# Patient Record
Sex: Male | Born: 2000 | Race: White | Hispanic: No | Marital: Single | Smoking: Never smoker
Health system: Southern US, Community
[De-identification: ages and names within clinical notes are randomized; demographics above are authoritative.]

---
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
## 2019-04-23 ENCOUNTER — Emergency Department (HOSPITAL_COMMUNITY): Payer: BC Managed Care – PPO

## 2019-04-23 ENCOUNTER — Encounter (HOSPITAL_COMMUNITY): Payer: Self-pay | Admitting: Emergency Medicine

## 2019-04-23 ENCOUNTER — Emergency Department (HOSPITAL_COMMUNITY)
Admission: EM | Admit: 2019-04-23 | Discharge: 2019-04-23 | Disposition: A | Discharge status: Home / Self Care | Payer: BC Managed Care – PPO | Attending: Emergency Medicine | Admitting: Emergency Medicine

## 2019-04-23 DIAGNOSIS — R0782 Intercostal pain: Secondary | ICD-10-CM | POA: Insufficient documentation

## 2019-04-23 DIAGNOSIS — Y929 Unspecified place or not applicable: Secondary | ICD-10-CM | POA: Insufficient documentation

## 2019-04-23 DIAGNOSIS — Y9351 Activity, roller skating (inline) and skateboarding: Secondary | ICD-10-CM | POA: Insufficient documentation

## 2019-04-23 DIAGNOSIS — M25522 Pain in left elbow: Secondary | ICD-10-CM | POA: Insufficient documentation

## 2019-04-23 DIAGNOSIS — S42022A Displaced fracture of shaft of left clavicle, initial encounter for closed fracture: Secondary | ICD-10-CM | POA: Insufficient documentation

## 2019-04-23 DIAGNOSIS — M549 Dorsalgia, unspecified: Secondary | ICD-10-CM | POA: Diagnosis not present

## 2019-04-23 DIAGNOSIS — Y999 Unspecified external cause status: Secondary | ICD-10-CM | POA: Insufficient documentation

## 2019-04-23 DIAGNOSIS — S4992XA Unspecified injury of left shoulder and upper arm, initial encounter: Secondary | ICD-10-CM | POA: Diagnosis present

## 2019-04-23 DIAGNOSIS — S20412A Abrasion of left back wall of thorax, initial encounter: Secondary | ICD-10-CM | POA: Insufficient documentation

## 2019-04-23 DIAGNOSIS — W19XXXA Unspecified fall, initial encounter: Secondary | ICD-10-CM

## 2019-04-23 DIAGNOSIS — M542 Cervicalgia: Secondary | ICD-10-CM | POA: Diagnosis not present

## 2019-04-23 MED ORDER — FENTANYL CITRATE (PF) 100 MCG/2ML IJ SOLN
50.0000 ug | Freq: Once | INTRAMUSCULAR | Status: AC
  Administered 2019-04-23: 17:00:00 50 ug via INTRAVENOUS
  Filled 2019-04-23: qty 2

## 2019-04-23 MED ORDER — OXYCODONE-ACETAMINOPHEN 5-325 MG PO TABS: 1.0000 | ORAL_TABLET | Freq: Four times a day (QID) | ORAL | 0 refills | Status: DC | PRN

## 2019-04-23 NOTE — Discharge Instructions (Addendum)
Please read and follow all provided instructions.  You have been seen today for a fall.  Your x-ray showed that you have a left clavicle fracture, we have placed you in a sling immobilizer for this, please keep this on at all times until you have followed up with orthopedics.  Please follow-up within the next couple of days.  Home care instructions: -- *PRICE in the first 24-48 hours after injury: Protect (with brace, splint, sling), if given by your provider Rest Ice- Do not apply ice pack directly to your skin, place towel or similar between your skin and ice/ice pack. Apply ice for 20 min, then remove for 40 min while awake Compression- Wear brace, elastic bandage, splint as directed by your provider Elevate affected extremity above the level of your heart when not walking around for the first 24-48 hours   Please apply antibiotic ointment to the abrasions to your back & arms.   Medications:  Please take ibuprofen for over the counter dosing.  Take Percocet for pain not alleviated by ibuprofen. -Percocet-this is a narcotic/controlled substance medication that has potential addicting qualities.  We recommend that you take 1-2 tablets every 6 hours as needed for severe pain.  Do not drive or operate heavy machinery when taking this medicine as it can be sedating. Do not drink alcohol or take other sedating medications when taking this medicine for safety reasons.  Keep this out of reach of small children.  Please be aware this medicine has Tylenol in it (325 mg/tab) do not exceed the maximum dose of Tylenol in a day per over the counter recommendations should you decide to supplement with Tylenol over the counter.   We have prescribed you new medication(s) today. Discuss the medications prescribed today with your pharmacist as they can have adverse effects and interactions with your other medicines including over the counter and prescribed medications. Seek medical evaluation if you start to  experience new or abnormal symptoms after taking one of these medicines, seek care immediately if you start to experience difficulty breathing, feeling of your throat closing, facial swelling, or rash as these could be indications of a more serious allergic reaction   Follow-up instructions: Please follow-up with orthopedics within the next 1-4 days.   Return instructions:  Please return if your digits or extremity are numb or tingling, appear gray or blue, or you have severe pain (also elevate the extremity and loosen splint or wrap if you were given one) Please return if you have redness or fevers.  Please return to the Emergency Department if you experience worsening symptoms.  Please return if you have any other emergent concerns. Additional Information:  Your vital signs today were: BP (!) 111/58 (BP Location: Right Arm)    Pulse 81    Temp 98.5 F (36.9 C) (Oral)    Resp 16    Ht 6' (1.829 m)    Wt 68 kg    SpO2 94%    BMI 20.34 kg/m  If your blood pressure (BP) was elevated above 135/85 this visit, please have this repeated by your doctor within one month. ---------------

## 2019-04-23 NOTE — ED Triage Notes (Signed)
Pt arrives to ED from home with complaints of falling off his skateboard injuring his left collar bone, neck, and back. EMS states possible left collar bone deformity. EMS gave 119mcg fentanyl.

## 2019-04-23 NOTE — ED Provider Notes (Signed)
[place] EMERGENCY DEPARTMENT Provider Note   CSN: [id number] Arrival date & time: 04/23/19  1536     History   Chief Complaint Chief Complaint  Patient presents with   Fall    HPI Calvin Perry is a 18 y.o. male without significant past medical history who presents to the emergency department via EMS status post mechanical fall from longboard with primary complaint of L clavicle pain.  Patient states that he was riding his long board fairly slowly when he had a tree branch causing him to fall.  He states he fell onto his left shoulder/side.  Denies head injury or loss of consciousness.  Was able to get up without assistance.  He notes he is having pain primarily to the left clavicle/shoulder area as well as to the neck and upper back.  His current pain is a 9 out of 10, this is somewhat improved following fentanyl per EMS in route, worse with movement.  Denies headache, change in vision, nausea, vomiting, numbness, weakness, paresthesias, abdominal pain, dyspnea, or hemoptysis.  States that he received vaccines prior to college, likely includes tetanus.     HPI  History reviewed. No pertinent past medical history.  There are no active problems to display for this patient.   History reviewed. No pertinent surgical history.      Home Medications    Prior to Admission medications   Medication Sig Start Date End Date Taking? Authorizing Provider  ibuprofen (ADVIL,MOTRIN) 400 MG tablet Take 1 tablet (400 mg total) by mouth every 6 (six) hours as needed. 07/12/14   Charlann Lange, PA-C    Family History History reviewed. No pertinent family history.  Social History Social History   Tobacco Use   Smoking status: Never Smoker  Substance Use Topics   Alcohol use: No   Drug use: No     Allergies   Patient has no known allergies.   Review of Systems Review of Systems  Constitutional: Negative for chills and fever.  Eyes: Negative for visual  disturbance.  Respiratory: Negative for shortness of breath.   Cardiovascular: Positive for chest pain (left clavicle/upper ribs).  Gastrointestinal: Negative for abdominal pain, nausea and vomiting.  Musculoskeletal: Positive for arthralgias, back pain and neck pain.  Skin: Positive for wound.  Neurological: Negative for weakness, numbness and headaches.       Negative for paresthesias.  All other systems reviewed and are negative.    Physical Exam Updated Vital Signs BP (!) 111/58 (BP Location: Right Arm)    Pulse 81    Temp 98.5 F (36.9 C) (Oral)    Resp 16    Ht 6' (1.829 m)    Wt 68 kg    SpO2 94%    BMI 20.34 kg/m   Physical Exam Vitals signs and nursing note reviewed.  Constitutional:      General: He is not in acute distress.    Appearance: He is well-developed. He is not toxic-appearing.  HENT:     Head: Normocephalic and atraumatic.     Comments: No raccoon eyes or battle sign.    Ears:     Comments: No hemotympanum.    Mouth/Throat:     Comments: Uvula midline. Eyes:     General:        Right eye: No discharge.        Left eye: No discharge.     Extraocular Movements: Extraocular movements intact.     Conjunctiva/sclera: Conjunctivae normal.  Pupils: Pupils are equal, round, and reactive to light.  Neck:     Musculoskeletal: Neck supple.     Comments: C-collar in place.  Patient has diffuse midline and left-sided paraspinal muscle tenderness with palpation through c-collar.  C-collar maintained in place.  No palpable step-off or point/focal bony tenderness. Cardiovascular:     Rate and Rhythm: Normal rate and regular rhythm.  Pulmonary:     Effort: Pulmonary effort is normal. No respiratory distress.     Breath sounds: Normal breath sounds. No wheezing, rhonchi or rales.     Comments: No palpable crepitus or step-off. Chest:     Chest wall: Tenderness (Left anterior, lateral, and posterior chest wall.  Is tenderness over the left clavicle.) present.    Abdominal:     General: There is no distension.     Palpations: Abdomen is soft.     Tenderness: There is no abdominal tenderness. There is no guarding or rebound.  Musculoskeletal:     Comments: Upper extremities: Patient has intact active range of motion to the right shoulder, bilateral elbows, bilateral wrist, and all digits.  His left shoulder flexion and extension is limited fairly significantly secondary to pain per his report.  LUE: He is tender to palpation diffusely about the posterior aspect of the elbow.  He is also tender diffusely to the glenohumeral joint, the [facility] joint, the CC joint, the clavicle, and the scapula.  Upper extremities are otherwise nontender. Back: Patient is tender to palpation to the left paraspinal muscles including the left posterior ribs.  He does have some mild tenderness to the midline thoracic area diffusely without point/focal bony tenderness or palpable step-off.  L-spine is nontender. Lower extremities: Normal active range of motion throughout without point/focal bony tenderness.  Skin:    General: Skin is warm and dry.     Capillary Refill: Capillary refill takes less than 2 seconds.     Comments: Patient has notable abrasions to the left upper arm as well as to the left side of the posterior chest extending to the mid back.  There is also an abrasion to the R elbow. No significant active bleeding.  Neurological:     Mental Status: He is alert.     Comments: Clear speech.  CN III through XII grossly intact.  Sensation grossly intact bilateral upper and lower extremities.  5 out of 5 symmetric grip strength and 5 out of 5 strength with plantar and dorsiflexion bilaterally.  Able to perform okay sign, thumbs up, and cross second and third digits with bilateral upper extremities.  Psychiatric:        Behavior: Behavior normal.    ED Treatments / Results  Labs (all labs ordered are listed, but only abnormal results are displayed) Labs Reviewed - No data to  display  EKG None  Radiology Dg Ribs Unilateral W/chest Left  Result Date: 04/23/2019 CLINICAL DATA:  18 year old male status post fall from long board. Pain. Left clavicle fracture. EXAM: LEFT RIBS AND CHEST - 3+ VIEW COMPARISON:  Thoracic radiographs today. FINDINGS: AP supine chest. Normal lung volumes. Normal cardiac size and mediastinal contours. Visualized tracheal air column is within normal limits. Both lungs appear clear. No pneumothorax or pleural effusion. Comminuted and displaced midshaft left clavicle fracture. Normal underlying bone mineralization. AP and oblique views of the left ribs. No rib fracture identified. Negative visible bowel gas pattern. Negative visible spine. IMPRESSION: 1. No rib fracture identified. Comminuted midshaft left clavicle fracture. 2. No acute cardiopulmonary abnormality.  Electronically Signed   By: Odessa FlemingH  Hall M.D.   On: 04/23/2019 18:18   Dg Thoracic Spine 2 View  Result Date: 04/23/2019 CLINICAL DATA:  18 year old male status post fall from long board. Pain including at the left clavicle. EXAM: THORACIC SPINE 2 VIEWS COMPARISON:  Thoracic spine radiographs 06/15/2010. FINDINGS: Normal thoracic segmentation as seen previously. Nearing skeletal maturity. Bone mineralization is within normal limits. Cervicothoracic junction alignment is within normal limits. Preserved thoracic vertebral height and alignment. Posterior ribs appear intact. Negative visible chest and upper abdominal visceral contours. IMPRESSION: No acute osseous abnormality identified in the thoracic spine. Electronically Signed   By: Odessa FlemingH  Hall M.D.   On: 04/23/2019 18:13   Dg Clavicle Left  Result Date: 04/23/2019 CLINICAL DATA:  Larey SeatFell from long board and injured left shoulder. EXAM: LEFT CLAVICLE - 2+ VIEWS COMPARISON:  None. FINDINGS: There is a displaced and comminuted fracture of the midclavicle. The proximal clavicle is elevated approximately 1 shaft with and there is a moderate-sized butterfly  type fragment. The sternoclavicular and AC joints are intact. IMPRESSION: Displaced and comminuted fracture of the mid clavicle. Electronically Signed   By: Rudie MeyerP.  Gallerani M.D.   On: 04/23/2019 18:14   Dg Elbow Complete Left  Result Date: 04/23/2019 CLINICAL DATA:  18 year old male status post fall from long board. Pain. EXAM: LEFT ELBOW - COMPLETE 3+ VIEW COMPARISON:  Left elbow series 01/06/2006. FINDINGS: Nearing skeletal maturity. Bone mineralization is within normal limits. Incidental antecubital fossa IV access. No evidence of joint effusion. Normal joint spaces and alignment. Radial head appears intact. No osseous abnormality identified. No discrete soft tissue injury identified. IMPRESSION: Negative. Electronically Signed   By: Odessa FlemingH  Hall M.D.   On: 04/23/2019 18:15   Ct Cervical Spine Wo Contrast  Result Date: 04/23/2019 CLINICAL DATA:  18 year old male status post fall from long board. Pain. Left clavicle fracture. EXAM: CT CERVICAL SPINE WITHOUT CONTRAST TECHNIQUE: Multidetector CT imaging of the cervical spine was performed without intravenous contrast. Multiplanar CT image reconstructions were also generated. COMPARISON:  Head and cervical spine CT 06/15/2010. FINDINGS: Alignment: Straightening of cervical lordosis. Cervicothoracic junction alignment is within normal limits. Bilateral posterior element alignment is within normal limits. Skull base and vertebrae: Visualized skull base is intact. No atlanto-occipital dissociation. Cervical vertebrae appear intact. Soft tissues and spinal canal: No prevertebral fluid or swelling. No visible canal hematoma. Negative noncontrast neck soft tissues. Disc levels:  No degenerative changes. Upper chest: Visible upper thoracic levels appear intact. Negative lung apices. Left clavicle fracture visible on the scout view. Other: Elongated stylohyoid ligament calcification. Negative visible posterior fossa. Visible tympanic cavities and mastoids are clear.  IMPRESSION: 1. No acute traumatic injury identified in the cervical spine. 2. Left clavicle fracture visible on the scout view. Electronically Signed   By: Odessa FlemingH  Hall M.D.   On: 04/23/2019 18:22   Dg Shoulder Left  Result Date: 04/23/2019 CLINICAL DATA:  18 year old male status post fall from long board. Pain. EXAM: LEFT SHOULDER - 2+ VIEW COMPARISON:  Thoracic spine radiographs 06/15/2010. FINDINGS: Comminuted and displaced midshaft left clavicle fracture. One full shaft with inferior displacement. Over riding of fragments at least 1 centimeter. Left AC joint alignment appears to remain normal. No glenohumeral joint dislocation. Left scapula and proximal left humerus appear intact. Negative visible left ribs and chest. IMPRESSION: Comminuted and displaced midshaft left clavicle fracture. No other acute fracture or dislocation identified about the left shoulder. Electronically Signed   By: Odessa FlemingH  Hall M.D.   On:  04/23/2019 18:16    Procedures Procedures (including critical care time)  SPLINT APPLICATION Date/Time: 7:19 PM Authorized by: Harvie HeckSamantha Nnenna Meador Consent: Verbal consent obtained. Risks and benefits: risks, benefits and alternatives were discussed Consent given by: patient Splint applied by: RN Location details: LUE Splint type: sling immobilizer Supplies used: sling immobilizer.  Post-procedure: The splinted body part was neurovascularly unchanged following the procedure. Patient tolerance: Patient tolerated the procedure well with no immediate complications.  Medications Ordered in ED Medications  fentaNYL (SUBLIMAZE) injection 50 mcg (has no administration in time range)    Initial Impression / Assessment and Plan / ED Course  I have reviewed the triage vital signs and the nursing notes.  Pertinent labs & imaging results that were available during my care of the patient were reviewed by me and considered in my medical decision making (see chart for details).   Patient presents  to the emergency department status post fall from his long board with primary complaint of left clavicle pain.  Patient is nontoxic-appearing, resting comfortably, vitals without significant abnormality- initial SpO2 documented @ 94% has been at 98-100% on each of my assessments.  Patient did not strike his head, he is neurologically intact, doubt head bleed.  C-collar in place, diffuse midline tenderness, CT cervical spine obtained per [place] rules, negative for fracture or dislocation- cleared.  Patient did have some diffuse thoracic tenderness, x-ray negative for fracture or dislocation.  No L-spine tenderness.  Musculoskeletal x-rays ordered in areas of tenderness, specifically to the left upper extremity/left chest.   Imaging notable for displaced and comminuted fracture of the mid clavicle. Imaging has been reviewed by me personally. Fracture is not to the medial aspect of the clavicle, no signs of respiratory distress, lungs clear, no hemoptysis. Doubt significant traumatic intrathoracic injury in addition to mid clavicle fx.  No skin tenting.  Neurovascular intact distally. Will place patient in sling immobilizer, recommendation for PRICE, short course of percocet for pain [facility]([place] Controlled Substance reporting System queried) with orthopedics follow up.   Abrasions to L upper arm/back do not appear to require closure w/ sutures/staples/adhesives, tetanus up to date. Wounds irrigated and cleaned by nursing staff. Wounds are not over clavicular fx, not open fx.   I discussed results, treatment plan, need for follow-up, and return precautions with the patient. Provided opportunity for questions, patient confirmed understanding and is in agreement with plan.   Findings and plan of care discussed with supervising physician Dr. Anitra LauthPlunkett who is in agreement.   Blood pressure (!) 111/58, pulse 81, temperature 98.5 F (36.9 C), temperature source Oral, resp. rate 16, height 6' (1.829 m),  weight 68 kg, SpO2 100 %.  Final Clinical Impressions(s) / ED Diagnoses   Final diagnoses:  Fall, initial encounter  Closed displaced fracture of shaft of left clavicle, initial encounter    ED Discharge Orders         Ordered    oxyCODONE-acetaminophen (PERCOCET/ROXICET) 5-325 MG tablet  Every 6 hours PRN     04/23/19 1927           Cherly Andersonetrucelli, Qualyn Oyervides R, PA-C 04/23/19 1932    Gwyneth SproutPlunkett, Whitney, MD 04/23/19 2135

## 2019-04-23 NOTE — ED Notes (Signed)
Mom- [phone] for updates

## 2019-12-04 DIAGNOSIS — L700 Acne vulgaris: Secondary | ICD-10-CM | POA: Diagnosis not present

## 2020-09-15 IMAGING — DX LEFT RIBS AND CHEST - 3+ VIEW
6 series · 6 of 6 positions shown · non-contrast
Comparison: Thoracic radiographs today.

CLINICAL DATA: 18-year-old male status post fall from long board.
Pain. Left clavicle fracture.

EXAM:
LEFT RIBS AND CHEST - 3+ VIEW

[chest ap (1 of 2)]
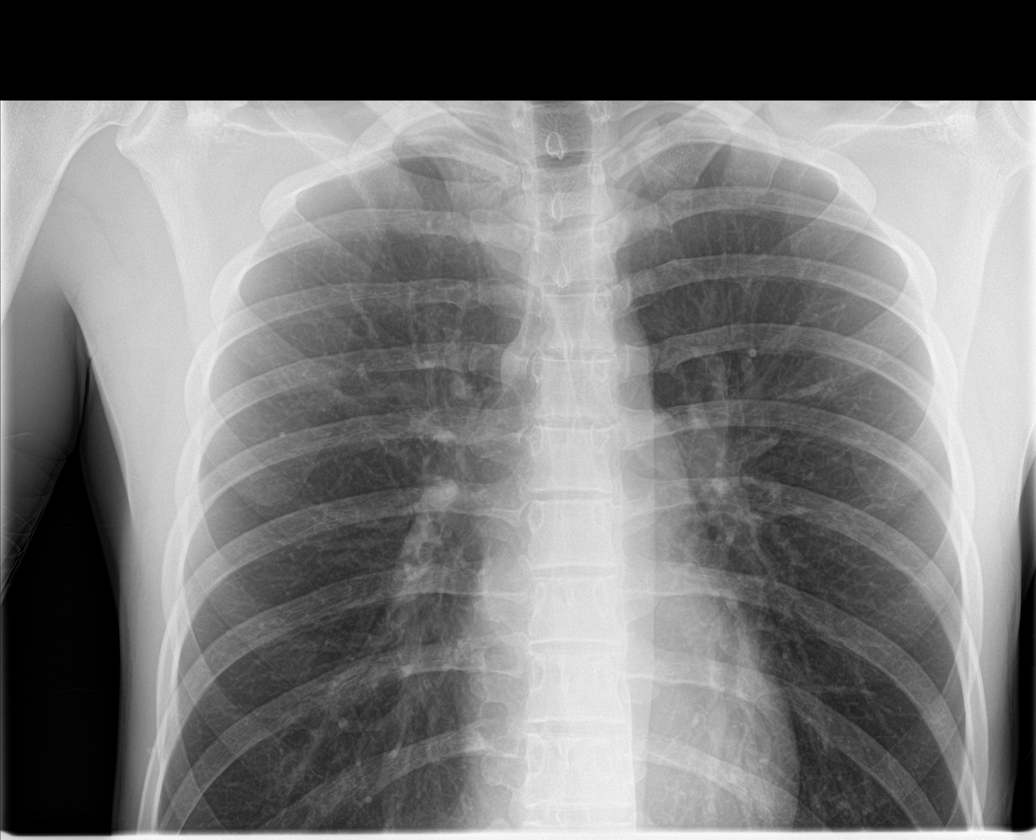

[rib ap (1 of 2)]
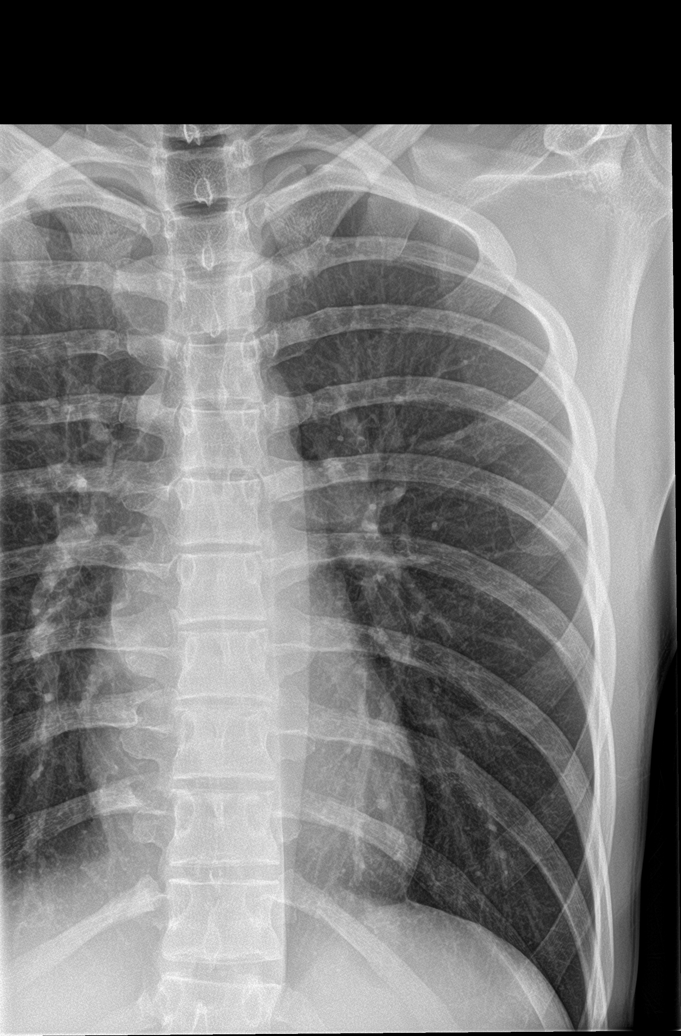

[rib ap obl (1 of 2)]
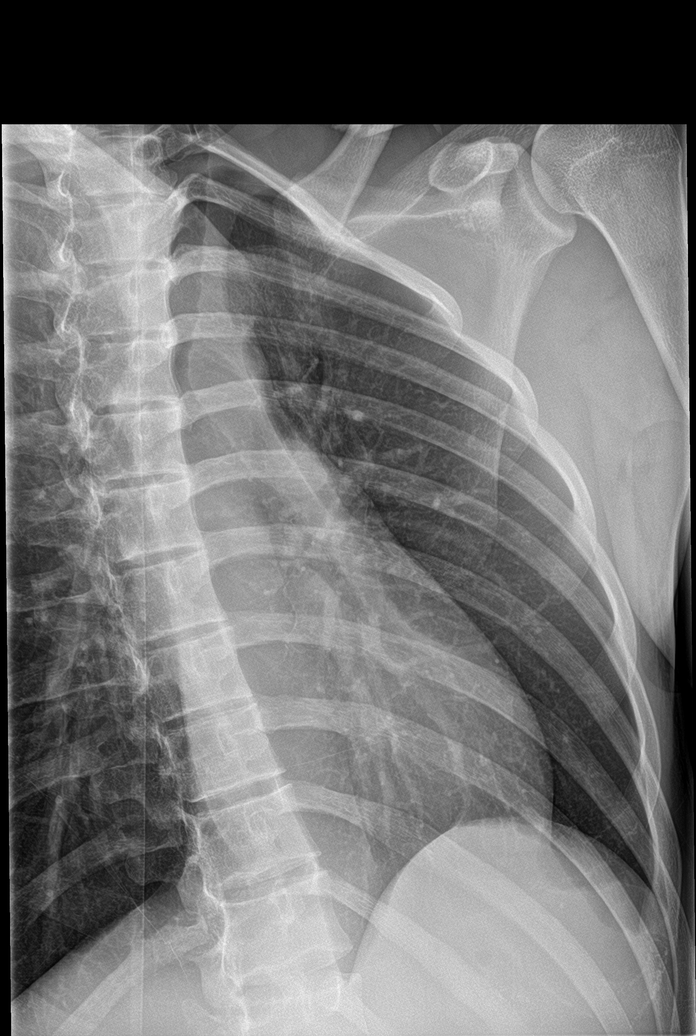

[chest ap (2 of 2)]
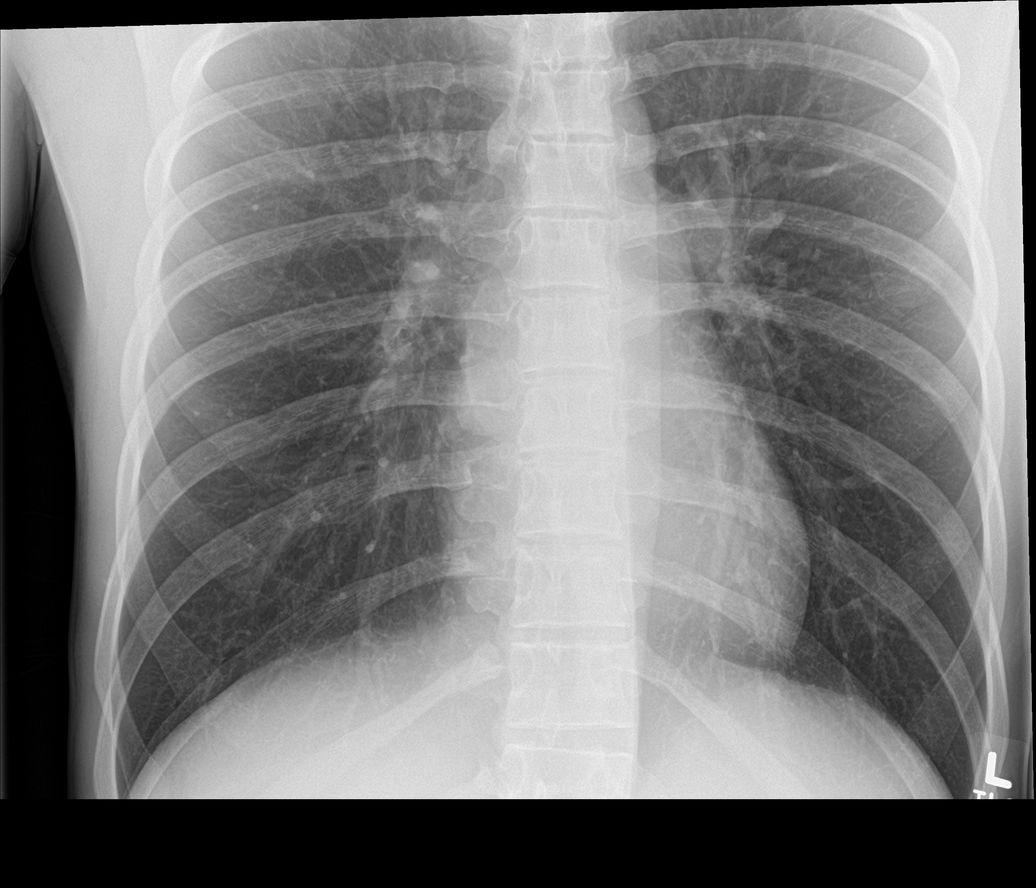

[rib ap (2 of 2)]
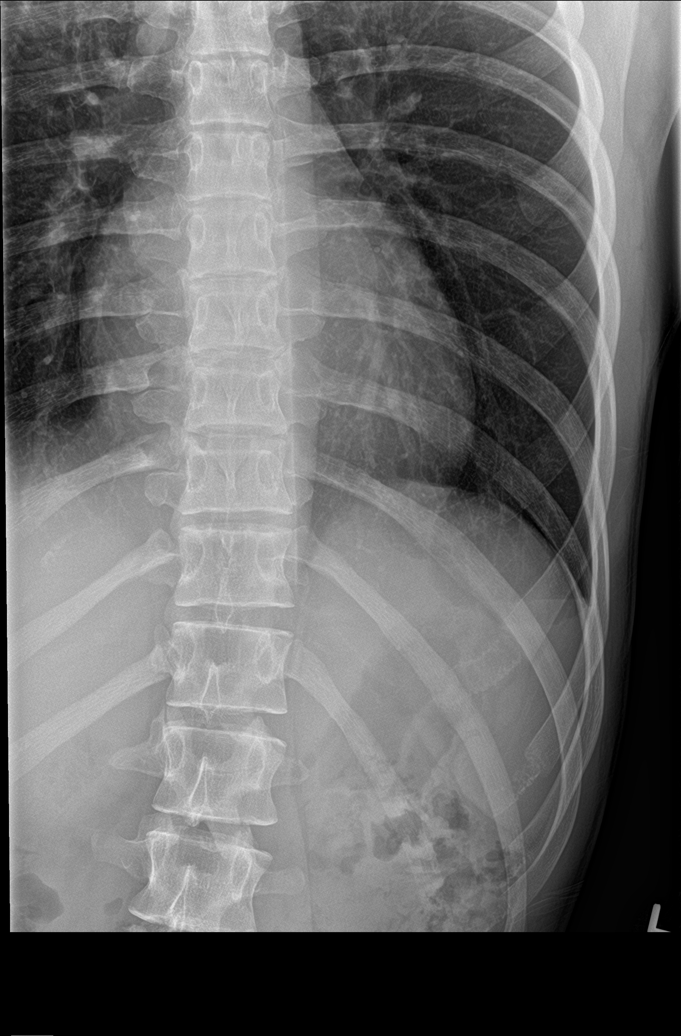

[rib ap obl (2 of 2)]
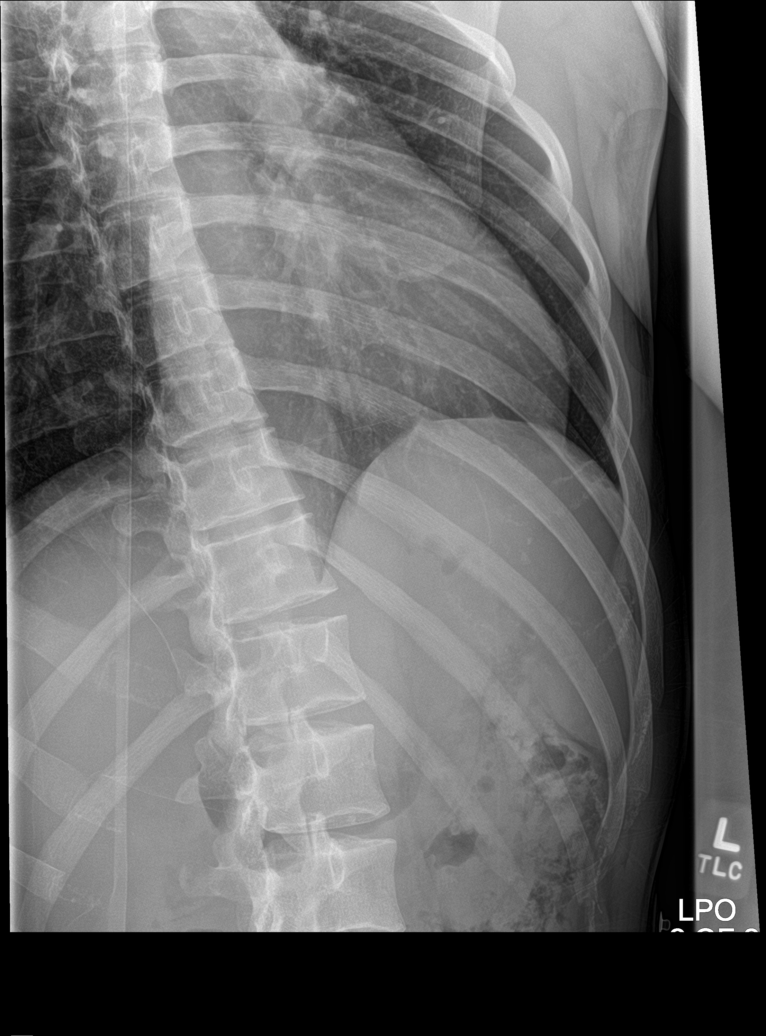

[6 of 6 positions shown; findings below may reference images not displayed]

FINDINGS: AP supine chest. Normal lung volumes. Normal cardiac size and
mediastinal contours. Visualized tracheal air column is within
normal limits. Both lungs appear clear. No pneumothorax or pleural
effusion.

Comminuted and displaced midshaft left clavicle fracture.

Normal underlying bone mineralization. AP and oblique views of the
left ribs. No rib fracture identified. Negative visible bowel gas
pattern. Negative visible spine.
IMPRESSION: 1. No rib fracture identified. Comminuted midshaft left clavicle
fracture.
2. No acute cardiopulmonary abnormality.

## 2021-04-21 DIAGNOSIS — Z0000 Encounter for general adult medical examination without abnormal findings: Secondary | ICD-10-CM | POA: Diagnosis not present

## 2021-04-21 DIAGNOSIS — E559 Vitamin D deficiency, unspecified: Secondary | ICD-10-CM | POA: Diagnosis not present

## 2021-04-21 DIAGNOSIS — Z13220 Encounter for screening for lipoid disorders: Secondary | ICD-10-CM | POA: Diagnosis not present

## 2021-04-28 DIAGNOSIS — L700 Acne vulgaris: Secondary | ICD-10-CM | POA: Diagnosis not present

## 2021-09-15 ENCOUNTER — Ambulatory Visit
Admission: RE | Admit: 2021-09-15 | Discharge: 2021-09-15 | Disposition: A | Discharge status: Home / Self Care | Payer: BC Managed Care – PPO | Source: Ambulatory Visit | Attending: Family Medicine | Admitting: Family Medicine

## 2021-09-15 ENCOUNTER — Ambulatory Visit (INDEPENDENT_AMBULATORY_CARE_PROVIDER_SITE_OTHER): Payer: BC Managed Care – PPO | Admitting: Family Medicine

## 2021-09-15 ENCOUNTER — Encounter: Payer: Self-pay | Admitting: Family Medicine

## 2021-09-15 VITALS — BP 112/74 | Wt 180.0 lb

## 2021-09-15 DIAGNOSIS — M545 Low back pain, unspecified: Secondary | ICD-10-CM | POA: Diagnosis not present

## 2021-09-15 DIAGNOSIS — G8929 Other chronic pain: Secondary | ICD-10-CM

## 2021-09-15 NOTE — Patient Instructions (Signed)
We will go ahead with an MRI of your lumbar spine to further assess for a disc herniation. I'll call you with the results of these and next steps. You have a lumbar strain. Ok to take tylenol for baseline pain relief (1-2 extra strength tabs 3x/day) Take aleve or ibuprofen with food for pain and inflammation if needed. Stay as active as possible. Do home exercises and stretches as directed most days of the week. Consider physical therapy. Strengthening of low back muscles, abdominal musculature are key for long term pain relief.

## 2021-09-15 NOTE — Progress Notes (Signed)
PCP: Timothy Lasso, MD  Subjective:   HPI: Patient is a 20 y.o. male here for low back pain.  Patient reports 3 year history of right sided low back pain. 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 Came down from a rebound and felt a sharp pain right side of low back. Recurrence when doing leg press then most recently when he played golf. Each time they last about 1.5 weeks then resolve. No radiation into extremities. No numbness or tingling. No bowel/bladder dysfunction. Worse with flexion. Takes ibuprofen when these occur.  History reviewed. No pertinent past medical history.  No current outpatient medications on file prior to visit.   No current facility-administered medications on file prior to visit.    History reviewed. No pertinent surgical history.  No Known Allergies  BP 112/74   Wt 180 lb (81.6 kg)   BMI 24.41 kg/m   [facility] 09/15/2021  Frequency of aerobic exercise (# of days/week) 6  Average time in minutes 30  Frequency of strengthening activities (# of days/week) 6    No flowsheet data found.      Objective:  Physical Exam:  Gen: NAD, comfortable in exam room  Back: No gross deformity, scoliosis. Mild right lumbar paraspinal TTP.  No midline or bony TTP. FROM. Strength LEs 5/5 all muscle groups.   2+ MSRs in patellar and achilles tendons, equal bilaterally. Negative SLRs. Negative stork test. Sensation intact to light touch bilaterally.   Assessment & Plan:  1. Recurrent chronic low back pain - exam reassuring currently but this issue has been recurring frequently over the past 3 years.  Will go ahead with radiographs then MRI to assess for disc herniation.  Home exercises reviewed.  Heat, tylenol, aleve or ibuprofen if needed.

## 2021-09-20 DIAGNOSIS — U071 COVID-19: Secondary | ICD-10-CM | POA: Diagnosis not present

## 2021-09-20 DIAGNOSIS — J069 Acute upper respiratory infection, unspecified: Secondary | ICD-10-CM | POA: Diagnosis not present

## 2021-10-08 DIAGNOSIS — M545 Low back pain, unspecified: Secondary | ICD-10-CM | POA: Diagnosis not present

## 2021-10-11 DIAGNOSIS — M545 Low back pain, unspecified: Secondary | ICD-10-CM | POA: Diagnosis not present

## 2021-10-14 ENCOUNTER — Other Ambulatory Visit: Payer: BC Managed Care – PPO

## 2022-02-25 DIAGNOSIS — K648 Other hemorrhoids: Secondary | ICD-10-CM | POA: Diagnosis not present

## 2023-02-08 IMAGING — CR DG LUMBAR SPINE 2-3V
2 series · 2 of 2 positions shown · non-contrast
Comparison: None.

CLINICAL DATA: Low back pain

EXAM:
LUMBAR SPINE - 2-3 VIEW

[w l-spine a.p. *]
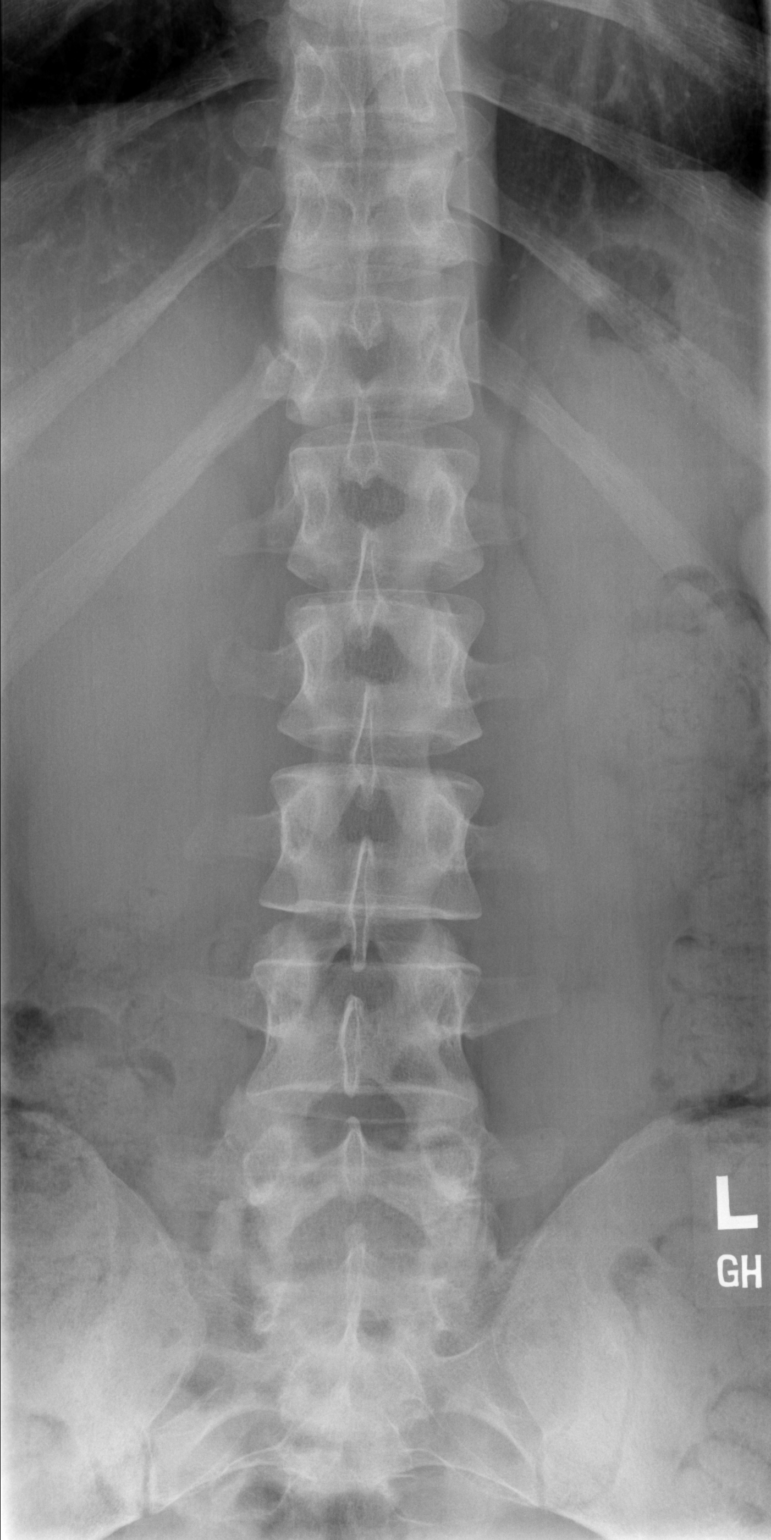

[w l-spine lat *]
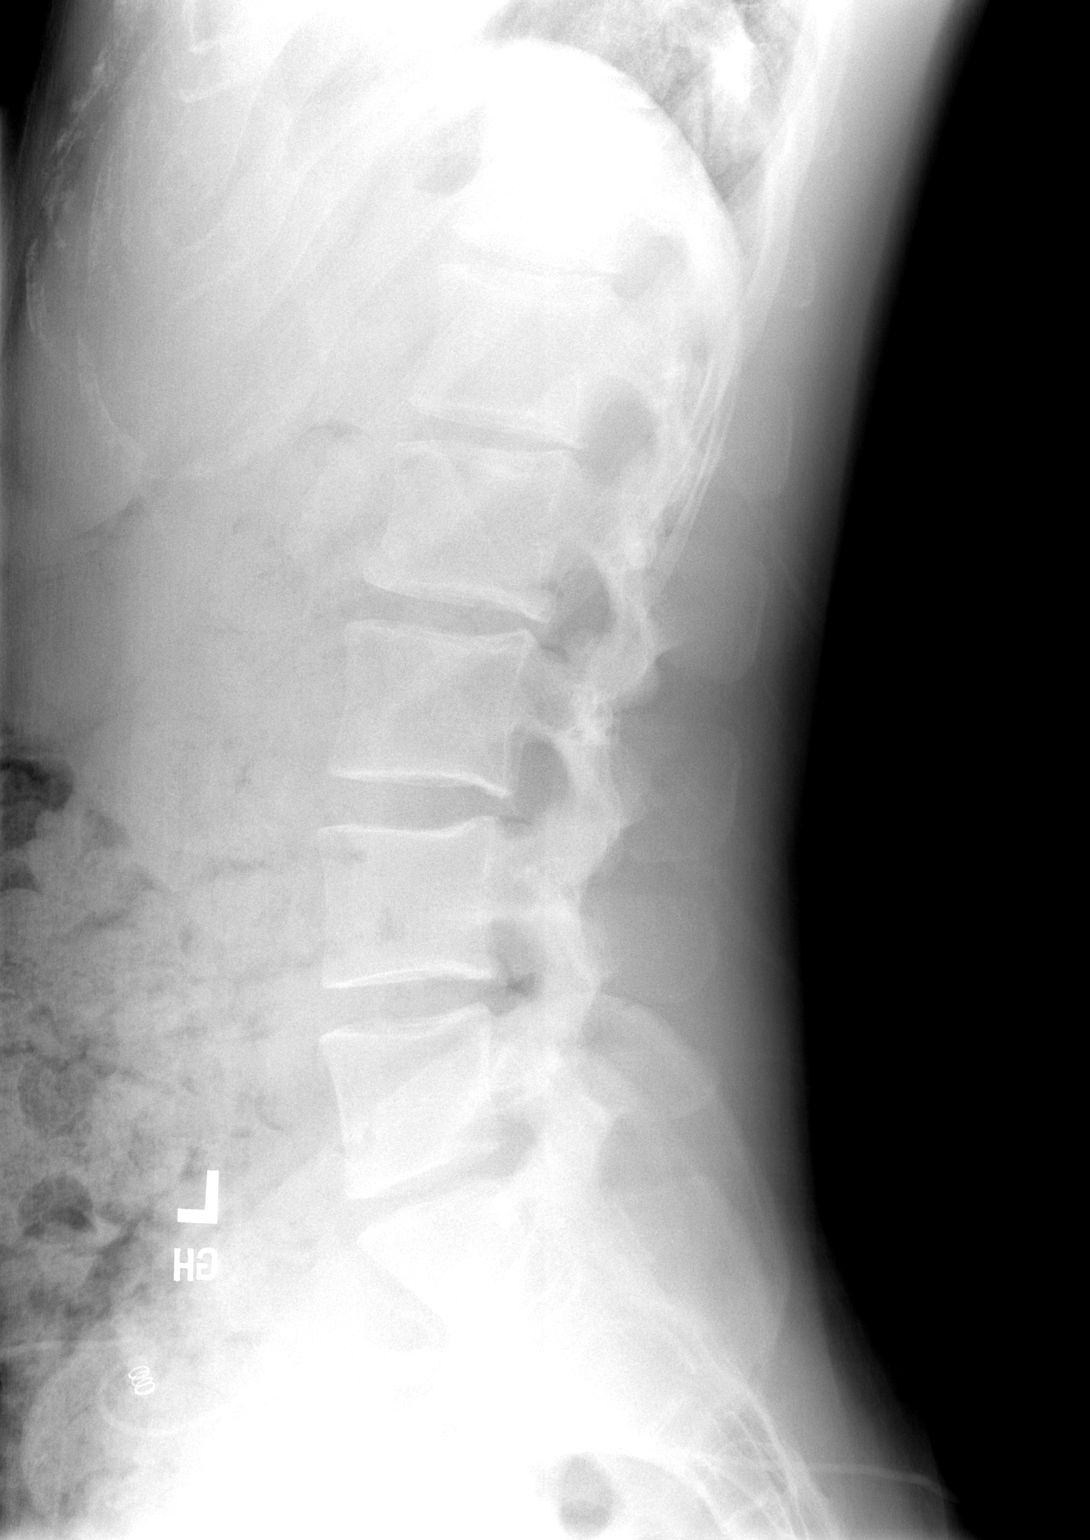

[2 of 2 positions shown; findings below may reference images not displayed]

FINDINGS: No recent fracture is seen. Alignment of posterior margins of
vertebral bodies is unremarkable. There is disc space narrowing at
L5-S1 level. Paraspinal soft tissues are unremarkable.
IMPRESSION: No recent fracture is seen. There is disc space narrowing at the
L5-S1 level.
